# Patient Record
Sex: Female | Born: 2000
Health system: Southern US, Community
[De-identification: ages and names within clinical notes are randomized; demographics above are authoritative.]

## PROBLEM LIST (undated history)

## (undated) DIAGNOSIS — F419 Anxiety disorder, unspecified: Secondary | ICD-10-CM

## (undated) DIAGNOSIS — K56609 Unspecified intestinal obstruction, unspecified as to partial versus complete obstruction: Secondary | ICD-10-CM

## (undated) DIAGNOSIS — F32A Depression, unspecified: Secondary | ICD-10-CM

## (undated) DIAGNOSIS — K589 Irritable bowel syndrome without diarrhea: Secondary | ICD-10-CM

## (undated) DIAGNOSIS — F329 Major depressive disorder, single episode, unspecified: Secondary | ICD-10-CM

## (undated) HISTORY — DX: Unspecified intestinal obstruction, unspecified as to partial versus complete obstruction: K56.609

## (undated) HISTORY — DX: Irritable bowel syndrome, unspecified: K58.9

## (undated) HISTORY — DX: Anxiety disorder, unspecified: F41.9

## (undated) HISTORY — PX: TONSILLECTOMY: SUR1361

## (undated) HISTORY — PX: APPENDECTOMY: SHX54

## (undated) HISTORY — DX: Major depressive disorder, single episode, unspecified: F32.9

## (undated) HISTORY — PX: CHOLECYSTECTOMY: SHX55

## (undated) HISTORY — DX: Depression, unspecified: F32.A

---
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
## 2019-03-23 DIAGNOSIS — Z20828 Contact with and (suspected) exposure to other viral communicable diseases: Secondary | ICD-10-CM | POA: Diagnosis not present

## 2019-04-18 DIAGNOSIS — Z332 Encounter for elective termination of pregnancy: Secondary | ICD-10-CM

## 2019-04-18 HISTORY — DX: Encounter for elective termination of pregnancy: Z33.2

## 2019-06-14 ENCOUNTER — Other Ambulatory Visit (HOSPITAL_COMMUNITY)
Admission: RE | Admit: 2019-06-14 | Discharge: 2019-06-14 | Disposition: A | Discharge status: Home / Self Care | Payer: BC Managed Care – PPO | Source: Ambulatory Visit | Attending: Obstetrics and Gynecology | Admitting: Obstetrics and Gynecology

## 2019-06-14 ENCOUNTER — Ambulatory Visit (INDEPENDENT_AMBULATORY_CARE_PROVIDER_SITE_OTHER): Payer: BC Managed Care – PPO | Admitting: Obstetrics and Gynecology

## 2019-06-14 ENCOUNTER — Encounter: Payer: Self-pay | Admitting: Obstetrics and Gynecology

## 2019-06-14 ENCOUNTER — Other Ambulatory Visit: Payer: Self-pay

## 2019-06-14 VITALS — BP 100/70 | Ht 63.0 in | Wt 161.0 lb

## 2019-06-14 DIAGNOSIS — Z30011 Encounter for initial prescription of contraceptive pills: Secondary | ICD-10-CM

## 2019-06-14 DIAGNOSIS — Z113 Encounter for screening for infections with a predominantly sexual mode of transmission: Secondary | ICD-10-CM

## 2019-06-14 DIAGNOSIS — Z01419 Encounter for gynecological examination (general) (routine) without abnormal findings: Secondary | ICD-10-CM | POA: Diagnosis not present

## 2019-06-14 MED ORDER — NORETHINDRONE 0.35 MG PO TABS: 1.0000 | ORAL_TABLET | Freq: Every day | ORAL | 3 refills | Status: DC

## 2019-06-14 NOTE — Progress Notes (Signed)
Chief Complaint  Patient presents with  . Gynecologic Exam     HPI:      [name] is a 19 y.o. G0P0000 who LMP was Patient's last menstrual period was 05/22/2019 (approximate)., presents today for her annual examination.  Her menses are regular every 2 months, lasting 4-7 days, mod to heavy flow. Dysmenorrhea mod, not improved with NSAIDs. She does not have intermenstrual bleeding. Tried xulane and OCPs (yaz) in past for cycles but had intolerable mood changes.   Sex activity: single partner, contraception - condoms. S/p EAB 2/21. Would like BC but can't tolerated xulane/estrogen OCPs. Can try prog only options/IUD.  Hx of STDs: none  There is no FH of breast cancer. There is no FH of ovarian cancer in her family. Confirmed her PGM had cx cancer instead. The patient does not do self-breast exams.  Tobacco use: The patient denies current or previous tobacco use. Alcohol use: none No drug use Exercise: moderately active  She does get adequate calcium but not Vitamin D in her diet.  She has not had Gardasil vaccine and mom declines.    Past Medical History:  Diagnosis Date  . Anxiety   . Bowel obstruction ([place])   . Depression   . IBS (irritable bowel syndrome)   . Termination of pregnancy (fetus) 04/2019    Past Surgical History:  Procedure Laterality Date  . APPENDECTOMY    . CHOLECYSTECTOMY    . TONSILLECTOMY      Family History  Problem Relation Age of Onset  . Diabetes Mellitus II Mother   . Cervical cancer Paternal Grandmother     Social History   Socioeconomic History  . Marital status: Single    Spouse name: Not on file  . Number of children: Not on file  . Years of education: Not on file  . Highest education level: Not on file  Occupational History  . Not on file  Tobacco Use  . Smoking status: Never Smoker  . Smokeless tobacco: Never Used  Substance and Sexual Activity  . Alcohol use: No  . Drug use: No  . Sexual activity: Yes     Birth control/protection: None, Condom  Other Topics Concern  . Not on file  Social History Narrative  . Not on file   Social Determinants of Health   Financial Resource Strain:   . Difficulty of Paying Living Expenses:   Food Insecurity:   . Worried About Charity fundraiser in the Last Year:   . Arboriculturist in the Last Year:   Transportation Needs:   . Film/video editor (Medical):   Marland Kitchen Lack of Transportation (Non-Medical):   Physical Activity:   . Days of Exercise per Week:   . Minutes of Exercise per Session:   Stress:   . Feeling of Stress :   Social Connections:   . Frequency of Communication with Friends and Family:   . Frequency of Social Gatherings with Friends and Family:   . Attends Religious Services:   . Active Member of Clubs or Organizations:   . Attends Archivist Meetings:   Marland Kitchen Marital Status:   Intimate Partner Violence:   . Fear of Current or Ex-Partner:   . Emotionally Abused:   Marland Kitchen Physically Abused:   . Sexually Abused:     Current Outpatient Medications on File Prior to Visit  Medication Sig Dispense Refill  . EPINEPHrine 0.3 mg/0.3 mL IJ SOAJ injection Inject into the  muscle.     No current facility-administered medications on file prior to visit.    ROS:  Review of Systems  Constitutional: Negative for fatigue, fever and unexpected weight change.  Respiratory: Negative for cough, shortness of breath and wheezing.   Cardiovascular: Negative for chest pain, palpitations and leg swelling.  Gastrointestinal: Negative for blood in stool, constipation, diarrhea, nausea and vomiting.  Endocrine: Negative for cold intolerance, heat intolerance and polyuria.  Genitourinary: Negative for dyspareunia, dysuria, flank pain, frequency, genital sores, hematuria, menstrual problem, pelvic pain, urgency, vaginal bleeding, vaginal discharge and vaginal pain.  Musculoskeletal: Negative for back pain, joint swelling and myalgias.  Skin:  Negative for rash.  Neurological: Negative for dizziness, syncope, light-headedness, numbness and headaches.  Hematological: Negative for adenopathy.  Psychiatric/Behavioral: Positive for agitation. Negative for confusion, dysphoric mood, sleep disturbance and suicidal ideas. The patient is not nervous/anxious.      Objective: BP 100/70   Ht 5\' 3"  (1.6 m)   Wt 161 lb (73 kg)   LMP 05/22/2019 (Approximate)   BMI 28.52 kg/m    Physical Exam Constitutional:      Appearance: She is well-developed.  Genitourinary:     Vulva, vagina, uterus, right adnexa and left adnexa normal.     No vulval lesion or tenderness noted.     No vaginal discharge, erythema or tenderness.     No cervical motion tenderness or polyp.     Uterus is not enlarged or tender.     No right or left adnexal mass present.     Right adnexa not tender.     Left adnexa not tender.  Neck:     Thyroid: No thyromegaly.  Cardiovascular:     Rate and Rhythm: Normal rate and regular rhythm.     Heart sounds: Normal heart sounds. No murmur.  Pulmonary:     Effort: Pulmonary effort is normal.     Breath sounds: Normal breath sounds.  Chest:     Breasts:        Right: No mass, nipple discharge, skin change or tenderness.        Left: No mass, nipple discharge, skin change or tenderness.  Abdominal:     Palpations: Abdomen is soft.     Tenderness: There is no abdominal tenderness. There is no guarding.  Musculoskeletal:        General: Normal range of motion.     Cervical back: Normal range of motion.  Neurological:     General: No focal deficit present.     Mental Status: She is alert and oriented to person, place, and time.     Cranial Nerves: No cranial nerve deficit.  Skin:    General: Skin is warm and dry.  Psychiatric:        Mood and Affect: Mood normal.        Behavior: Behavior normal.        Thought Content: Thought content normal.        Judgment: Judgment normal.  Vitals reviewed.      Assessment/Plan: Encounter for annual routine gynecological examination  Screening for STD (sexually transmitted disease) - Plan: Cervicovaginal ancillary only  Encounter for initial prescription of contraceptive pills - Plan: norethindrone (MICRONOR) 0.35 MG tablet; Prog only options/IUD discussed. Pt wants to try POPs. Start with menses. Condoms. Rx camila (not slynd due to emotional side effects with yaz). F/u prn.   Meds ordered this encounter  Medications  . norethindrone (MICRONOR) 0.35 MG tablet  Sig: Take 1 tablet (0.35 mg total) by mouth daily.    Dispense:  84 tablet    Refill:  3    Order Specific Question:   Supervising Provider    Answer:   Nadara Mustard [[id number]]             GYN counsel adequate intake of calcium and vitamin D, diet and exercise     F/U  Return in about 1 year (around 06/13/2020).  Domonick Sittner B. Rashelle Ireland, PA-C 06/14/2019 1:50 PM

## 2019-06-14 NOTE — Patient Instructions (Signed)
I value your feedback and entrusting us with your care. If you get a [facility] patient survey, I would appreciate you taking the time to let us know about your experience today. Thank you!  As of February 24, 2019, your lab results will be released to your MyChart immediately, before I even have a chance to see them. Please give me time to review them and contact you if there are any abnormalities. Thank you for your patience.  

## 2019-06-16 LAB — CERVICOVAGINAL ANCILLARY ONLY
Chlamydia: NEGATIVE
Comment: NEGATIVE
Comment: NORMAL
Neisseria Gonorrhea: NEGATIVE

## 2019-10-31 DIAGNOSIS — Z20822 Contact with and (suspected) exposure to covid-19: Secondary | ICD-10-CM | POA: Diagnosis not present

## 2019-12-15 ENCOUNTER — Telehealth: Payer: Self-pay

## 2019-12-15 NOTE — Telephone Encounter (Signed)
Pt calling to talk to [facility] about her bc and periods; mychart isn't working.  [phone] Mailbox is full.

## 2019-12-20 ENCOUNTER — Other Ambulatory Visit: Payer: Self-pay | Admitting: Obstetrics and Gynecology

## 2019-12-20 DIAGNOSIS — Z30011 Encounter for initial prescription of contraceptive pills: Secondary | ICD-10-CM

## 2019-12-20 MED ORDER — MICROGESTIN 24 FE 1-20 MG-MCG PO TABS: 1.0000 | ORAL_TABLET | Freq: Every day | ORAL | 1 refills | Status: DC

## 2019-12-20 NOTE — Telephone Encounter (Signed)
Pt called triage line stating she called last week and she is waiting on a call back.

## 2019-12-20 NOTE — Telephone Encounter (Signed)
Have pt take UPT. If neg, then irreg bleeding can happen with POPs. Can try different pills with estrogen (she had mood changes in past but could try another kind), depo, nexplanon or IUD.

## 2019-12-20 NOTE — Telephone Encounter (Signed)
Called pt, no answer, taken straight to voicemail, LVMTRC.

## 2019-12-20 NOTE — Telephone Encounter (Signed)
Lomedia eRxd. Change to new pills whenever she wants. Condoms for 1 wk. May have BTB first 1-2 packs but should improve quickly. F/u prn mood changes/prn sx.

## 2019-12-20 NOTE — Progress Notes (Signed)
OCP change to lomedia from camila due to BTB. F/u prn sx.

## 2019-12-20 NOTE — Telephone Encounter (Signed)
Pt says she started new [facility] 06/16/19. Had a normal cycle in April. She started bleeding every other week in May all the way to August. Has been bleeding pretty much every day since then (says maybe 7 days in total since then no bleeding), no abnormal pain. Denies changing more than once in less than an hour. Please advise.

## 2019-12-20 NOTE — Telephone Encounter (Signed)
Pt aware, says she would like to try different pills with estrogen. Said definitely not depo. Wanted to mention that she bleeds during intercourse (not bleeding before intercourse and during she does).

## 2019-12-21 NOTE — Telephone Encounter (Signed)
Pt aware.

## 2019-12-21 NOTE — Telephone Encounter (Signed)
Called pt, no answer, LVMTRC. 

## 2019-12-28 NOTE — Telephone Encounter (Signed)
Pt's mom, Vilinda Flake, calling; pt is away at college; is having a lot of sharp pain/pressure - seems to come in waves, and has started bleeding again.  [phone] (mom)

## 2019-12-28 NOTE — Telephone Encounter (Signed)
Pts mom aware. Says there is only a nurse at school so if her daughter needs anything from a doctor she will have her come down here since she is only 50 mins away.

## 2019-12-28 NOTE — Telephone Encounter (Signed)
Please advise 

## 2019-12-28 NOTE — Telephone Encounter (Signed)
Have pt take UPT. If neg, BTB with OCP and pill change normal. NSAIDs for pain. If persists, f/u with student health.

## 2020-05-08 ENCOUNTER — Encounter: Payer: Self-pay | Admitting: Obstetrics and Gynecology

## 2020-05-08 ENCOUNTER — Other Ambulatory Visit: Payer: Self-pay

## 2020-05-08 ENCOUNTER — Ambulatory Visit (INDEPENDENT_AMBULATORY_CARE_PROVIDER_SITE_OTHER): Payer: BC Managed Care – PPO | Admitting: Obstetrics and Gynecology

## 2020-05-08 ENCOUNTER — Other Ambulatory Visit (HOSPITAL_COMMUNITY)
Admission: RE | Admit: 2020-05-08 | Discharge: 2020-05-08 | Disposition: A | Discharge status: Home / Self Care | Payer: BC Managed Care – PPO | Source: Ambulatory Visit | Attending: Obstetrics and Gynecology | Admitting: Obstetrics and Gynecology

## 2020-05-08 VITALS — BP 104/70 | Ht 63.0 in | Wt 164.0 lb

## 2020-05-08 DIAGNOSIS — Z202 Contact with and (suspected) exposure to infections with a predominantly sexual mode of transmission: Secondary | ICD-10-CM | POA: Insufficient documentation

## 2020-05-08 DIAGNOSIS — Z113 Encounter for screening for infections with a predominantly sexual mode of transmission: Secondary | ICD-10-CM | POA: Diagnosis not present

## 2020-05-08 DIAGNOSIS — A749 Chlamydial infection, unspecified: Secondary | ICD-10-CM

## 2020-05-08 DIAGNOSIS — Z3009 Encounter for other general counseling and advice on contraception: Secondary | ICD-10-CM | POA: Diagnosis not present

## 2020-05-08 NOTE — Progress Notes (Signed)
Patient, No Pcp Per   Chief Complaint  Patient presents with  . STD testing    HPI:      [name] is a 20 y.o. G1P0010 whose LMP was Patient's last menstrual period was 04/29/2020 (exact date)., presents today for STD testing. Previous partner notified her that he tested positive for chlamydia. Pt has new partner now, not using condoms. No vag sx, no LBP, pelvic pain, no fevers.   Started POPs 3/21 but had BTB. Changed to lomedia 10/21. Having monthly menses, lasting 6 days but pt has mood changes, like with xulane and previous estrogen pills. Tried POPs, but still had mood changes.    Past Medical History:  Diagnosis Date  . Anxiety   . Bowel obstruction ([facility])   . Depression   . IBS (irritable bowel syndrome)   . Termination of pregnancy (fetus) 04/2019    Past Surgical History:  Procedure Laterality Date  . APPENDECTOMY    . CHOLECYSTECTOMY    . TONSILLECTOMY      Family History  Problem Relation Age of Onset  . Diabetes Mellitus II Mother   . Cervical cancer Paternal Grandmother     Social History   Socioeconomic History  . Marital status: Single    Spouse name: Not on file  . Number of children: Not on file  . Years of education: Not on file  . Highest education level: Not on file  Occupational History  . Not on file  Tobacco Use  . Smoking status: Never Smoker  . Smokeless tobacco: Never Used  Vaping Use  . Vaping Use: Never used  Substance and Sexual Activity  . Alcohol use: No  . Drug use: No  . Sexual activity: Yes    Birth control/protection: None, Condom, Pill  Other Topics Concern  . Not on file  Social History Narrative  . Not on file   Social Determinants of Health   Financial Resource Strain: Not on file  Food Insecurity: Not on file  Transportation Needs: Not on file  Physical Activity: Not on file  Stress: Not on file  Social Connections: Not on file  Intimate Partner Violence: Not on file    Outpatient  Medications Prior to Visit  Medication Sig Dispense Refill  . EPINEPHrine 0.3 mg/0.3 mL IJ SOAJ injection Inject into the muscle.    . Norethindrone Acetate-Ethinyl Estrad-FE (MICROGESTIN 24 FE) 1-20 MG-MCG(24) tablet Take 1 tablet by mouth daily. 84 tablet 1   No facility-administered medications prior to visit.      ROS:  Review of Systems  Constitutional: Negative for fever, malaise/fatigue and weight loss.  Gastrointestinal: Negative for blood in stool, constipation, diarrhea, nausea and vomiting.  Genitourinary: Negative for dyspareunia, dysuria, flank pain, frequency, hematuria, urgency, vaginal bleeding, vaginal discharge and vaginal pain.  Musculoskeletal: Negative for back pain.  Skin: Negative for itching and rash.    OBJECTIVE:   Vitals:  BP 104/70   Ht 5\' 3"  (1.6 m)   Wt 164 lb (74.4 kg)   LMP 04/29/2020 (Exact Date)   BMI 29.05 kg/m   Physical Exam Vitals reviewed.  Constitutional:      Appearance: She is well-developed.  Pulmonary:     Effort: Pulmonary effort is normal.  Genitourinary:    General: Normal vulva.     Pubic Area: No rash.      Labia:        Right: No rash, tenderness or lesion.  Left: No rash, tenderness or lesion.      Vagina: Normal. No vaginal discharge, erythema or tenderness.     Cervix: Normal.     Uterus: Normal. Not enlarged and not tender.      Adnexa: Right adnexa normal and left adnexa normal.       Right: No mass or tenderness.         Left: No mass or tenderness.    Musculoskeletal:        General: Normal range of motion.     Cervical back: Normal range of motion.  Skin:    General: Skin is warm and dry.  Neurological:     General: No focal deficit present.     Mental Status: She is alert and oriented to person, place, and time.  Psychiatric:        Mood and Affect: Mood normal.        Behavior: Behavior normal.        Thought Content: Thought content normal.        Judgment: Judgment normal.      Assessment/Plan: Screening for STD (sexually transmitted disease) - Plan: Cervicovaginal ancillary only; STD testing today. Will f/u if pos. Pt declines other blood draw labs today. May want in future  Exposure to STD - Plan: Cervicovaginal ancillary only  Encounter for other general counseling or advice on contraception--discussed nexplanon and IUDs (kyleena and paragard). Pt to f/u for placement with menses if desires.     Return if symptoms worsen or fail to improve.  Alicia B. Copland, PA-C 05/08/2020 3:11 PM

## 2020-05-08 NOTE — Patient Instructions (Signed)
I value your feedback and you entrusting us with your care. If you get a [facility] patient survey, I would appreciate you taking the time to let us know about your experience today. Thank you! ? ? ?

## 2020-05-10 LAB — CERVICOVAGINAL ANCILLARY ONLY
Chlamydia: POSITIVE — AB
Comment: NEGATIVE
Comment: NEGATIVE
Comment: NORMAL
Neisseria Gonorrhea: NEGATIVE
Trichomonas: NEGATIVE

## 2020-05-11 ENCOUNTER — Encounter: Payer: Self-pay | Admitting: Obstetrics and Gynecology

## 2020-05-11 DIAGNOSIS — A749 Chlamydial infection, unspecified: Secondary | ICD-10-CM | POA: Insufficient documentation

## 2020-05-11 MED ORDER — AZITHROMYCIN 500 MG PO TABS: 1000.0000 mg | ORAL_TABLET | Freq: Once | ORAL | 0 refills | Status: AC

## 2020-05-11 NOTE — Addendum Note (Signed)
Addended by: Althea Grimmer B on: 05/11/2020 05:46 PM   Modules accepted: Orders

## 2020-05-11 NOTE — Progress Notes (Signed)
Pls confirm pt sees results and notify [facility]. Thx.

## 2020-05-14 NOTE — Progress Notes (Signed)
Pt has followed up via mychart.

## 2020-10-23 ENCOUNTER — Encounter: Payer: Self-pay | Admitting: Obstetrics and Gynecology

## 2020-10-24 ENCOUNTER — Other Ambulatory Visit: Payer: Self-pay | Admitting: Obstetrics and Gynecology

## 2020-10-24 MED ORDER — XULANE 150-35 MCG/24HR TD PTWK: 1.0000 | MEDICATED_PATCH | TRANSDERMAL | 0 refills | Status: AC

## 2020-10-24 NOTE — Progress Notes (Signed)
Rx xulane, change from pills due to needing non-daily option

## 2020-11-18 ENCOUNTER — Other Ambulatory Visit: Payer: Self-pay | Admitting: Obstetrics and Gynecology

## 2020-11-20 ENCOUNTER — Other Ambulatory Visit: Payer: Self-pay | Admitting: Obstetrics and Gynecology
